# Patient Record
Sex: Male | Born: 2000 | Race: Asian | Hispanic: No | Marital: Single | Smoking: Never smoker
Health system: Southern US, Community
[De-identification: ages and names within clinical notes are randomized; demographics above are authoritative.]

## PROBLEM LIST (undated history)

## (undated) DIAGNOSIS — Z8709 Personal history of other diseases of the respiratory system: Secondary | ICD-10-CM

## (undated) DIAGNOSIS — S43015A Anterior dislocation of left humerus, initial encounter: Secondary | ICD-10-CM

## (undated) DIAGNOSIS — M2410 Other articular cartilage disorders, unspecified site: Secondary | ICD-10-CM

## (undated) DIAGNOSIS — F50814 Binge eating disorder, in remission: Secondary | ICD-10-CM

## (undated) DIAGNOSIS — F909 Attention-deficit hyperactivity disorder, unspecified type: Secondary | ICD-10-CM

## (undated) DIAGNOSIS — S43006A Unspecified dislocation of unspecified shoulder joint, initial encounter: Secondary | ICD-10-CM

## (undated) HISTORY — DX: Binge eating disorder, in remission: F50.814

## (undated) HISTORY — DX: Personal history of other diseases of the respiratory system: Z87.09

---
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
## 2019-01-17 DIAGNOSIS — Z111 Encounter for screening for respiratory tuberculosis: Secondary | ICD-10-CM | POA: Diagnosis not present

## 2019-01-17 DIAGNOSIS — Z0283 Encounter for blood-alcohol and blood-drug test: Secondary | ICD-10-CM | POA: Diagnosis not present

## 2019-03-11 DIAGNOSIS — Z20828 Contact with and (suspected) exposure to other viral communicable diseases: Secondary | ICD-10-CM | POA: Diagnosis not present

## 2019-03-13 DIAGNOSIS — H5213 Myopia, bilateral: Secondary | ICD-10-CM | POA: Diagnosis not present

## 2019-03-30 ENCOUNTER — Ambulatory Visit: Payer: Self-pay | Attending: Internal Medicine

## 2019-03-30 DIAGNOSIS — Z20822 Contact with and (suspected) exposure to covid-19: Secondary | ICD-10-CM

## 2019-03-31 LAB — NOVEL CORONAVIRUS, NAA: SARS-CoV-2, NAA: NOT DETECTED

## 2019-12-19 DIAGNOSIS — J00 Acute nasopharyngitis [common cold]: Secondary | ICD-10-CM | POA: Diagnosis not present

## 2019-12-19 DIAGNOSIS — Z23 Encounter for immunization: Secondary | ICD-10-CM | POA: Diagnosis not present

## 2019-12-25 DIAGNOSIS — Z713 Dietary counseling and surveillance: Secondary | ICD-10-CM | POA: Diagnosis not present

## 2020-01-01 DIAGNOSIS — Z713 Dietary counseling and surveillance: Secondary | ICD-10-CM | POA: Diagnosis not present

## 2020-01-08 DIAGNOSIS — Z713 Dietary counseling and surveillance: Secondary | ICD-10-CM | POA: Diagnosis not present

## 2020-01-22 DIAGNOSIS — Z713 Dietary counseling and surveillance: Secondary | ICD-10-CM | POA: Diagnosis not present

## 2020-01-29 DIAGNOSIS — Z713 Dietary counseling and surveillance: Secondary | ICD-10-CM | POA: Diagnosis not present

## 2020-02-05 DIAGNOSIS — Z713 Dietary counseling and surveillance: Secondary | ICD-10-CM | POA: Diagnosis not present

## 2020-02-11 DIAGNOSIS — Z713 Dietary counseling and surveillance: Secondary | ICD-10-CM | POA: Diagnosis not present

## 2020-02-13 ENCOUNTER — Other Ambulatory Visit (HOSPITAL_COMMUNITY): Payer: Self-pay

## 2020-02-26 DIAGNOSIS — Z713 Dietary counseling and surveillance: Secondary | ICD-10-CM | POA: Diagnosis not present

## 2020-03-10 DIAGNOSIS — Z713 Dietary counseling and surveillance: Secondary | ICD-10-CM | POA: Diagnosis not present

## 2020-03-19 ENCOUNTER — Other Ambulatory Visit (HOSPITAL_COMMUNITY): Payer: Self-pay

## 2020-03-25 DIAGNOSIS — Z713 Dietary counseling and surveillance: Secondary | ICD-10-CM | POA: Diagnosis not present

## 2020-04-15 DIAGNOSIS — Z713 Dietary counseling and surveillance: Secondary | ICD-10-CM | POA: Diagnosis not present

## 2020-05-14 ENCOUNTER — Other Ambulatory Visit (HOSPITAL_COMMUNITY): Payer: Self-pay

## 2020-07-15 ENCOUNTER — Other Ambulatory Visit (HOSPITAL_COMMUNITY): Payer: Self-pay

## 2020-07-15 MED FILL — Dexmethylphenidate HCl Tab 5 MG: ORAL | 30 days supply | Qty: 30 | Fill #0 | Status: AC

## 2020-07-15 MED FILL — Dexmethylphenidate HCl Cap ER 24 HR 30 MG: ORAL | 30 days supply | Qty: 30 | Fill #0 | Status: AC

## 2020-07-29 DIAGNOSIS — Z20822 Contact with and (suspected) exposure to covid-19: Secondary | ICD-10-CM | POA: Diagnosis not present

## 2020-08-11 ENCOUNTER — Other Ambulatory Visit (HOSPITAL_COMMUNITY): Payer: Self-pay

## 2020-08-11 MED ORDER — DEXMETHYLPHENIDATE HCL ER 30 MG PO CP24: 30.0000 mg | ORAL_CAPSULE | Freq: Every morning | ORAL | 0 refills | Status: DC

## 2020-08-11 MED ORDER — DEXMETHYLPHENIDATE HCL 5 MG PO TABS
5.0000 mg | ORAL_TABLET | Freq: Every day | ORAL | 0 refills | Status: DC
  Filled 2020-08-11: qty 30, 30d supply, fill #0

## 2020-08-11 MED ORDER — DEXMETHYLPHENIDATE HCL 5 MG PO TABS: 5.0000 mg | ORAL_TABLET | Freq: Every day | ORAL | 0 refills | Status: DC

## 2020-08-11 MED ORDER — DEXMETHYLPHENIDATE HCL ER 30 MG PO CP24
30.0000 mg | ORAL_CAPSULE | Freq: Every morning | ORAL | 0 refills | Status: DC
  Filled 2020-08-11: qty 30, 30d supply, fill #0

## 2020-09-10 ENCOUNTER — Other Ambulatory Visit (HOSPITAL_COMMUNITY): Payer: Self-pay

## 2020-09-10 MED ORDER — DEXMETHYLPHENIDATE HCL 5 MG PO TABS
5.0000 mg | ORAL_TABLET | Freq: Every day | ORAL | 0 refills | Status: DC
  Filled 2020-09-10: qty 30, 30d supply, fill #0

## 2020-09-10 MED ORDER — DEXMETHYLPHENIDATE HCL ER 30 MG PO CP24
1.0000 | ORAL_CAPSULE | Freq: Every morning | ORAL | 0 refills | Status: DC
  Filled 2020-09-10: qty 30, 30d supply, fill #0

## 2020-09-11 ENCOUNTER — Ambulatory Visit: Payer: 59 | Admitting: Family Medicine

## 2020-09-11 ENCOUNTER — Other Ambulatory Visit: Payer: Self-pay

## 2020-09-11 VITALS — BP 162/82 | Ht 66.0 in | Wt 205.0 lb

## 2020-09-11 DIAGNOSIS — M79672 Pain in left foot: Secondary | ICD-10-CM

## 2020-09-11 NOTE — Progress Notes (Signed)
Office Visit Note   Patient: Gavin Williams           Date of Birth: Aug 02, 2000           MRN: [id number] Visit Date: 09/11/2020 Requested by: Eliberto Ivory, MD [address] [facility], [place]. [place],  [place] [address] PCP: Eliberto Ivory, MD  Subjective: CC: Left foot pain  HPI: 20 year old male presenting to clinic with concerns of approximately 2 weeks of left foot pain.  Patient states that he tried running barefoot on a treadmill, which is an activity he has not done before, and he started to experience pain in his left forefoot.  He says he usually runs shod for about 20 to 30 minutes stretches, and this episode of barefoot running was approximately 30 minutes long.  Following this treadmill exercise, he states that he had significant pain with ambulation, and was limping for several days afterward.  He took ibuprofen which offered significant improvement.  He has rested his foot by avoiding running since the time of this injury, and has been using the stationary bike for exercise.  He tried running again earlier this week, and noticed a recurrence of his pain, which prompted his appointment today.  He no longer notices pain with ambulation.  He feels as though the pain is along the lateral aspect of his foot, though he has difficulty finding an exact spot.  He has no history of foot trauma or stress injuries.  He states his current running shoes are a little over 29 year old. Of note, patient is leaving for the [place] tomorrow for a study abroad trip.  He will not have access to a stationary bike [place], and is wondering what he can do for exercise while his foot is recovering.              ROS:   All other systems were reviewed and are negative.  Objective: Vital Signs: BP (!) 162/82   Ht 5\' 6"  (1.676 m)   Wt 205 lb (93 kg)   BMI 33.09 kg/m  [facility] Adult Exercise 09/11/2020  Frequency of aerobic exercise (# of  days/week) 6  Average time in minutes 45  Frequency of strengthening activities (# of days/week) 3     No flowsheet data found.  Physical Exam:  General:  Alert and oriented, in no acute distress. Pulm:  Breathing unlabored. Psy:  Normal mood, congruent affect. Skin: Left foot without bruises, rashes, or erythema. Overlying skin intact.   Left foot/ankle exam: General assessment: Normal Gait.  Inspection: No significant pes planus or cavus. Normal posterior tibialis function with feel raise.   No significant swelling or deformity.  Seated Exam: Ankle and all toes with full range of motion with no pain. Palpation: Endorses tenderness to palpation over the distal aspect of the fourth metatarsal shaft and metatarsal phalangeal joint.  Full range of motion and strength in the fourth toe without pain.  No tenderness along any of the other metatarsals, or within the midfoot.  No pain with calf or Achilles squeeze.  No Tenderness over the peroneal tendons.   Ligamentous Examination:  No Tenderness or laxity along medial or lateral ligamentous complexes.  Strength: 5/5 in eversion, inversion, and plantar/dorsiflexion.  Strength testing is not painful. Normal distal sensation   Imaging: Limited extremity ultrasound-left foot: Area of maximal tenderness correlates with distal aspect of fourth metatarsal.  There is no obvious cortical irregularity or overlying periosteal effusions.  Minimally increased Doppler flow in this area. No obvious spurring or effusions at the fourth MTP joint.  Impression: No evidence for stress fracture  Assessment & Plan: 20 year old male presenting to clinic with 2 weeks of left forefoot pain after running barefoot for the first time.  Suspect bony contusion or early stress reaction as an underlying source of his discomfort.  No pain with ambulation, so do not feel as though he would benefit from postop shoe or other immobilization at this time. -Encouraged to  remain on stationary bike for another 2 weeks if available at his study abroad location.  Otherwise he may try swimming if he has access, or walking for exercise. -Discussed that when he returns to running he should do a slow progression as his pain allows.  If he decides to continue with minimalist/barefoot running, he should reduce his running distance by about 50% from his typical shod running distance.  He should then increase by no more than 10 %/week. -His running shoes in clinic today appear quite old with near complete wear of the heel tread.  Discussed that he should invest in new wear shoes prior to his trip, as I suspect refreshed padding would offer significant improvement.  He does not have pain with ambulation in clinic, so we will hold on orthotics today pending new shoes. -Denies the need for prescription NSAID medications at this time.  Does not feel he will need them during his trip, as he has had good pain control with over-the-counter ibuprofen.  Discussed safe NSAID use. -If his pain has not resolved by the time he returns from his study abroad trip, he is to return to clinic for reevaluation. -Patient expressed understanding and agreement with plan.  He had no further questions or concerns today.     Procedures: No procedures performed      Addendum:  I was the preceptor for this visit and available for immediate consultation.  Norton Blizzard MD Marrianne Mood

## 2020-09-11 NOTE — Patient Instructions (Signed)
Thank you for coming to visit [place] today!  I suspect you stressed your left foot with your introduction to barefoot running.  Fortunately, I do not see any evidence of a stress fracture on your ultrasound today. -Continue with a stationary bike for another 1 to 2 weeks until you no longer feel pain within your foot. -Once her pain is resolved, you may try to return to a walk/run progression to assure your pain does not recur. -If able to tolerate this, you may slowly increase your running by no more than 10% weekly. -If your pain does not improve by the time you return from your trip, come back to clinic for reevaluation.

## 2020-10-13 ENCOUNTER — Other Ambulatory Visit (HOSPITAL_COMMUNITY): Payer: Self-pay

## 2020-10-13 MED ORDER — DEXMETHYLPHENIDATE HCL ER 30 MG PO CP24
1.0000 | ORAL_CAPSULE | Freq: Every morning | ORAL | 0 refills | Status: DC
  Filled 2020-10-13: qty 30, 30d supply, fill #0

## 2020-10-13 MED ORDER — DEXMETHYLPHENIDATE HCL 5 MG PO TABS
5.0000 mg | ORAL_TABLET | Freq: Every day | ORAL | 0 refills | Status: DC
  Filled 2020-10-13: qty 30, 30d supply, fill #0

## 2020-10-23 ENCOUNTER — Other Ambulatory Visit (HOSPITAL_COMMUNITY): Payer: Self-pay

## 2020-11-10 ENCOUNTER — Other Ambulatory Visit (HOSPITAL_COMMUNITY): Payer: Self-pay

## 2020-11-10 MED ORDER — DEXMETHYLPHENIDATE HCL 5 MG PO TABS
5.0000 mg | ORAL_TABLET | Freq: Every day | ORAL | 0 refills | Status: DC
  Filled 2020-12-13: qty 30, 30d supply, fill #0

## 2020-11-10 MED ORDER — DEXMETHYLPHENIDATE HCL ER 30 MG PO CP24
30.0000 mg | ORAL_CAPSULE | Freq: Every morning | ORAL | 0 refills | Status: DC
  Filled 2021-01-19: qty 30, 30d supply, fill #0

## 2020-11-10 MED ORDER — DEXMETHYLPHENIDATE HCL 5 MG PO TABS
5.0000 mg | ORAL_TABLET | Freq: Every day | ORAL | 0 refills | Status: DC
  Filled 2020-11-10: qty 30, 30d supply, fill #0

## 2020-11-10 MED ORDER — DEXMETHYLPHENIDATE HCL ER 30 MG PO CP24
30.0000 mg | ORAL_CAPSULE | Freq: Every morning | ORAL | 0 refills | Status: DC
  Filled 2020-11-10: qty 30, 30d supply, fill #0

## 2020-11-10 MED ORDER — DEXMETHYLPHENIDATE HCL 5 MG PO TABS
5.0000 mg | ORAL_TABLET | Freq: Every day | ORAL | 0 refills | Status: DC
  Filled 2021-01-19: qty 30, 30d supply, fill #0

## 2020-11-10 MED ORDER — DEXMETHYLPHENIDATE HCL ER 30 MG PO CP24
30.0000 mg | ORAL_CAPSULE | Freq: Every morning | ORAL | 0 refills | Status: DC
  Filled 2020-12-13: qty 30, 30d supply, fill #0

## 2020-11-11 ENCOUNTER — Other Ambulatory Visit (HOSPITAL_COMMUNITY): Payer: Self-pay

## 2020-12-13 ENCOUNTER — Other Ambulatory Visit (HOSPITAL_COMMUNITY): Payer: Self-pay

## 2020-12-25 DIAGNOSIS — Z23 Encounter for immunization: Secondary | ICD-10-CM | POA: Diagnosis not present

## 2021-01-19 ENCOUNTER — Other Ambulatory Visit (HOSPITAL_COMMUNITY): Payer: Self-pay

## 2021-01-20 ENCOUNTER — Other Ambulatory Visit (HOSPITAL_COMMUNITY): Payer: Self-pay

## 2021-02-02 ENCOUNTER — Other Ambulatory Visit (HOSPITAL_COMMUNITY): Payer: Self-pay

## 2021-02-02 MED ORDER — DEXMETHYLPHENIDATE HCL ER 30 MG PO CP24
ORAL_CAPSULE | ORAL | 0 refills | Status: DC
  Filled 2021-04-12: qty 30, fill #0
  Filled 2021-04-23: qty 30, 30d supply, fill #0

## 2021-02-02 MED ORDER — DEXMETHYLPHENIDATE HCL 5 MG PO TABS
ORAL_TABLET | ORAL | 0 refills | Status: DC
  Filled 2021-03-24: qty 30, 30d supply, fill #0

## 2021-02-02 MED ORDER — DEXMETHYLPHENIDATE HCL ER 30 MG PO CP24
ORAL_CAPSULE | ORAL | 0 refills | Status: DC
  Filled 2021-02-02: qty 30, 30d supply, fill #0
  Filled 2021-02-06: qty 30, fill #0
  Filled 2021-02-11: qty 30, 30d supply, fill #0

## 2021-02-02 MED ORDER — DEXMETHYLPHENIDATE HCL 5 MG PO TABS
ORAL_TABLET | ORAL | 0 refills | Status: DC
  Filled 2021-04-12: qty 30, fill #0
  Filled 2021-04-23: qty 30, 30d supply, fill #0

## 2021-02-02 MED ORDER — DEXMETHYLPHENIDATE HCL ER 30 MG PO CP24
ORAL_CAPSULE | ORAL | 0 refills | Status: DC
  Filled 2021-03-24: qty 30, 30d supply, fill #0

## 2021-02-02 MED ORDER — DEXMETHYLPHENIDATE HCL 5 MG PO TABS
ORAL_TABLET | ORAL | 0 refills | Status: DC
  Filled 2021-02-02: qty 30, 30d supply, fill #0
  Filled 2021-02-06: qty 30, fill #0
  Filled 2021-02-11: qty 30, 30d supply, fill #0

## 2021-02-06 ENCOUNTER — Other Ambulatory Visit (HOSPITAL_COMMUNITY): Payer: Self-pay

## 2021-02-11 ENCOUNTER — Other Ambulatory Visit (HOSPITAL_COMMUNITY): Payer: Self-pay

## 2021-02-13 ENCOUNTER — Other Ambulatory Visit (HOSPITAL_COMMUNITY): Payer: Self-pay

## 2021-03-24 ENCOUNTER — Other Ambulatory Visit (HOSPITAL_COMMUNITY): Payer: Self-pay

## 2021-03-30 DIAGNOSIS — Z0184 Encounter for antibody response examination: Secondary | ICD-10-CM | POA: Diagnosis not present

## 2021-03-30 DIAGNOSIS — Z23 Encounter for immunization: Secondary | ICD-10-CM | POA: Diagnosis not present

## 2021-04-09 DIAGNOSIS — Z23 Encounter for immunization: Secondary | ICD-10-CM | POA: Diagnosis not present

## 2021-04-13 ENCOUNTER — Other Ambulatory Visit (HOSPITAL_COMMUNITY): Payer: Self-pay

## 2021-04-23 ENCOUNTER — Other Ambulatory Visit (HOSPITAL_COMMUNITY): Payer: Self-pay

## 2021-04-23 DIAGNOSIS — J029 Acute pharyngitis, unspecified: Secondary | ICD-10-CM | POA: Diagnosis not present

## 2021-04-23 DIAGNOSIS — R6883 Chills (without fever): Secondary | ICD-10-CM | POA: Diagnosis not present

## 2021-04-23 DIAGNOSIS — J069 Acute upper respiratory infection, unspecified: Secondary | ICD-10-CM | POA: Diagnosis not present

## 2021-05-11 DIAGNOSIS — F900 Attention-deficit hyperactivity disorder, predominantly inattentive type: Secondary | ICD-10-CM | POA: Diagnosis not present

## 2021-05-12 DIAGNOSIS — Z299 Encounter for prophylactic measures, unspecified: Secondary | ICD-10-CM | POA: Diagnosis not present

## 2021-05-15 ENCOUNTER — Other Ambulatory Visit (HOSPITAL_COMMUNITY): Payer: Self-pay

## 2021-05-15 MED ORDER — DEXMETHYLPHENIDATE HCL ER 30 MG PO CP24
ORAL_CAPSULE | ORAL | 0 refills | Status: DC
  Filled 2021-07-10: qty 30, 30d supply, fill #0

## 2021-05-15 MED ORDER — DEXMETHYLPHENIDATE HCL 5 MG PO TABS
ORAL_TABLET | ORAL | 0 refills | Status: DC
  Filled 2021-05-15: qty 60, 30d supply, fill #0

## 2021-05-15 MED ORDER — DEXMETHYLPHENIDATE HCL ER 30 MG PO CP24
ORAL_CAPSULE | ORAL | 0 refills | Status: DC
  Filled 2021-07-28: qty 30, fill #0
  Filled 2021-07-30 – 2021-08-06 (×2): qty 30, 30d supply, fill #0

## 2021-05-15 MED ORDER — DEXMETHYLPHENIDATE HCL ER 30 MG PO CP24
ORAL_CAPSULE | ORAL | 0 refills | Status: DC
  Filled 2021-05-15: qty 30, 30d supply, fill #0
  Filled 2021-05-18: qty 30, fill #0
  Filled 2021-05-21: qty 30, 30d supply, fill #0

## 2021-05-18 ENCOUNTER — Other Ambulatory Visit (HOSPITAL_COMMUNITY): Payer: Self-pay

## 2021-05-21 ENCOUNTER — Other Ambulatory Visit (HOSPITAL_COMMUNITY): Payer: Self-pay

## 2021-07-10 ENCOUNTER — Other Ambulatory Visit (HOSPITAL_COMMUNITY): Payer: Self-pay

## 2021-07-15 ENCOUNTER — Other Ambulatory Visit (HOSPITAL_COMMUNITY): Payer: Self-pay

## 2021-07-21 ENCOUNTER — Other Ambulatory Visit (HOSPITAL_COMMUNITY): Payer: Self-pay

## 2021-07-28 ENCOUNTER — Other Ambulatory Visit (HOSPITAL_COMMUNITY): Payer: Self-pay

## 2021-07-30 ENCOUNTER — Other Ambulatory Visit (HOSPITAL_COMMUNITY): Payer: Self-pay

## 2021-08-03 ENCOUNTER — Other Ambulatory Visit (HOSPITAL_COMMUNITY): Payer: Self-pay

## 2021-08-04 DIAGNOSIS — Z111 Encounter for screening for respiratory tuberculosis: Secondary | ICD-10-CM | POA: Diagnosis not present

## 2021-08-06 ENCOUNTER — Other Ambulatory Visit (HOSPITAL_COMMUNITY): Payer: Self-pay

## 2021-08-07 ENCOUNTER — Other Ambulatory Visit (HOSPITAL_COMMUNITY): Payer: Self-pay

## 2021-08-12 ENCOUNTER — Other Ambulatory Visit (HOSPITAL_COMMUNITY): Payer: Self-pay

## 2021-08-12 DIAGNOSIS — F900 Attention-deficit hyperactivity disorder, predominantly inattentive type: Secondary | ICD-10-CM | POA: Diagnosis not present

## 2021-08-12 MED ORDER — DEXMETHYLPHENIDATE HCL ER 30 MG PO CP24
ORAL_CAPSULE | ORAL | 0 refills | Status: DC
  Filled 2021-09-03: qty 30, 30d supply, fill #0

## 2021-08-12 MED ORDER — DEXMETHYLPHENIDATE HCL 5 MG PO TABS
ORAL_TABLET | ORAL | 0 refills | Status: DC
  Filled 2021-08-12: qty 16, 8d supply, fill #0
  Filled 2021-08-12: qty 44, 22d supply, fill #0

## 2021-08-12 MED ORDER — DEXMETHYLPHENIDATE HCL ER 30 MG PO CP24
ORAL_CAPSULE | ORAL | 0 refills | Status: DC
  Filled 2021-08-12 – 2021-10-01 (×2): qty 30, 30d supply, fill #0

## 2021-08-19 ENCOUNTER — Other Ambulatory Visit (HOSPITAL_COMMUNITY): Payer: Self-pay

## 2021-09-03 ENCOUNTER — Other Ambulatory Visit (HOSPITAL_COMMUNITY): Payer: Self-pay

## 2021-09-16 ENCOUNTER — Telehealth: Payer: Self-pay | Admitting: Internal Medicine

## 2021-09-16 NOTE — Telephone Encounter (Signed)
Gavin Williams is son of Dr Pearlean Brownie. Having astham and eczema. Wanting new consult before he heads off to college. NExt week. Can you fit him in please. I can come early 8.30am or last thing in morning next Friday 09/25/21

## 2021-09-17 ENCOUNTER — Telehealth: Payer: Self-pay | Admitting: Internal Medicine

## 2021-09-17 NOTE — Telephone Encounter (Signed)
Called patient to scheduled appointment. Was not able to leave message.

## 2021-09-17 NOTE — Telephone Encounter (Signed)
See encounter from 09/16/2021.

## 2021-09-21 NOTE — Telephone Encounter (Signed)
Called pt and left voicemail. Stated that MR could see him on 7/7 at either 8:30 or 11:45.

## 2021-09-25 ENCOUNTER — Ambulatory Visit: Payer: 59 | Admitting: Internal Medicine

## 2021-09-25 ENCOUNTER — Encounter: Payer: Self-pay | Admitting: Internal Medicine

## 2021-09-25 ENCOUNTER — Ambulatory Visit (INDEPENDENT_AMBULATORY_CARE_PROVIDER_SITE_OTHER): Payer: 59

## 2021-09-25 ENCOUNTER — Other Ambulatory Visit (HOSPITAL_COMMUNITY): Payer: Self-pay

## 2021-09-25 VITALS — BP 128/72 | HR 84 | Temp 97.9°F | Ht 66.0 in | Wt 187.8 lb

## 2021-09-25 DIAGNOSIS — R062 Wheezing: Secondary | ICD-10-CM

## 2021-09-25 DIAGNOSIS — R059 Cough, unspecified: Secondary | ICD-10-CM

## 2021-09-25 LAB — NITRIC OXIDE: Nitric Oxide: 22

## 2021-09-25 LAB — CBC WITH DIFFERENTIAL/PLATELET
Basophils Absolute: 0 10*3/uL (ref 0.0–0.1)
Basophils Relative: 0.5 % (ref 0.0–3.0)
Eosinophils Absolute: 0.2 10*3/uL (ref 0.0–0.7)
Eosinophils Relative: 3.1 % (ref 0.0–5.0)
HCT: 49.3 % (ref 39.0–52.0)
Hemoglobin: 16.6 g/dL (ref 13.0–17.0)
Lymphocytes Relative: 45 % (ref 12.0–46.0)
Lymphs Abs: 2.5 10*3/uL (ref 0.7–4.0)
MCHC: 33.6 g/dL (ref 30.0–36.0)
MCV: 91.6 fl (ref 78.0–100.0)
Monocytes Absolute: 0.7 10*3/uL (ref 0.1–1.0)
Monocytes Relative: 12.6 % — ABNORMAL HIGH (ref 3.0–12.0)
Neutro Abs: 2.2 10*3/uL (ref 1.4–7.7)
Neutrophils Relative %: 38.8 % — ABNORMAL LOW (ref 43.0–77.0)
Platelets: 259 10*3/uL (ref 150.0–400.0)
RBC: 5.38 Mil/uL (ref 4.22–5.81)
RDW: 13.7 % (ref 11.5–15.5)
WBC: 5.5 10*3/uL (ref 4.0–10.5)

## 2021-09-25 MED ORDER — ALBUTEROL SULFATE HFA 108 (90 BASE) MCG/ACT IN AERS
2.0000 | INHALATION_SPRAY | Freq: Four times a day (QID) | RESPIRATORY_TRACT | 6 refills | Status: DC | PRN
Start: 2021-09-25 — End: 2023-12-12
  Filled 2021-09-25: qty 18, 25d supply, fill #0

## 2021-09-25 MED ORDER — FLUTICASONE FUROATE-VILANTEROL 100-25 MCG/ACT IN AEPB
1.0000 | INHALATION_SPRAY | Freq: Every day | RESPIRATORY_TRACT | 5 refills | Status: DC
  Filled 2021-09-25: qty 60, 30d supply, fill #0

## 2021-09-25 MED ORDER — FLUTICASONE FUROATE-VILANTEROL 100-25 MCG/ACT IN AEPB: 1.0000 | INHALATION_SPRAY | Freq: Every day | RESPIRATORY_TRACT | 0 refills | Status: DC

## 2021-09-25 NOTE — Patient Instructions (Addendum)
ICD-10-CM   1. Cough, unspecified type  R05.9     2. Wheeze  R06.2       Very likely asthma  though feno test is 22ppb and normal  Plan  - cxr 2 view - cbc with diff, IgE and RAST allergy panel blood work 09/25/2021 - start breo 100 strength 1 puff daily scheduled - start albuterol as needed  - both medicines to have 12 month refills - recommend doing full PFT breathing test when you have time next fw months  Followup - video call in 4-8 weeks with DR Marchelle Gearing or APP - to get results and ensure treatment plan is working - 3-12 months or sooner if needed

## 2021-09-25 NOTE — Progress Notes (Signed)
OV 09/25/2021  Subjective:  Patient ID: Gavin Williams, male , DOB: 02/19/2001 , age 21 y.o. , MRN: [id number] , ADDRESS: [address] [place] [address] PCP Eliberto Ivory, MD Patient Care Team: Eliberto Ivory, MD as PCP - General (Pediatrics)  This Provider for this visit: Treatment Team:  Attending Provider: Kalman Shan, MD    09/25/2021 -   Chief Complaint  Patient presents with   Consult    Pt states he was diagnosed with asthma as a child and states he has not had any problems until about 5 weeks ago. States he will begin to wheeze and have a wet cough. Denies any problems with SOB or chest tightness.     HPI Gavin Williams 21 y.o. -as a new consult.  He is finished undergraduate in [place].  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  He has 4 weeks remaining before he starts medical school.  He says that very early on in childhood he had asthma but he does not remember this.  This what his parents told him.  He has been athletic playing tennis for [employer] day school and subsequently also an [place] folk music dance aerobic kind of activity.  Some 5 weeks ago he switched to long distance running.  He covers 3-5 miles in 30 minutes this was on a treadmill and outdoors.  Around the same time he started noticing development of a "wet cough".  Although it sounds wet he does not bring out anything but occasionally bring out white stuff.  He feels there is an associated wheeze with this periodically.  When he runs the cough does not bother him but sometimes he will notice a wheeze.  His running energy levels are not impacted by the symptoms.  The symptoms are not present during the night.  There is no shortness of breath there is no chest pain.  2 of his cousins have asthma on his maternal side.  His exam nitric oxide today was normal  He is willing to try inhaler  It will be difficult for him to follow-up physically here although  is willing to do video visits once August 2023 starts.    CT Chest data  No results found.    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   reports that he has never smoked. He has never used smokeless tobacco.  Past Surgical History:  Procedure Laterality Date   SHOULDER ARTHROSCOPY WITH BANKART REPAIR Left 03/16/2016   Procedure: LEFT SHOULDER ARTHROSCOPY, DEBRIDEMENT WITH BANKART REPAIR;  Surgeon: Teryl Lucy, MD;  Location: [facility];  Service: Orthopedics;  Laterality: Left;    No Known Allergies   There is no immunization history on file for this patient.  Family History  Problem Relation Age of Onset   Hypertension Mother    Hyperlipidemia Maternal Grandmother    Diabetes Maternal Grandfather    Hypertension Maternal Grandfather    Heart disease Paternal Grandfather      Current Outpatient Medications:    dexmethylphenidate (FOCALIN) 5 MG tablet, Take 1 to 2 tablets by mouth daily as needed, Disp: 60 tablet, Rfl: 0   Dexmethylphenidate HCl (FOCALIN XR) 30 MG CP24, Take 1 capsule by  mouth in the morning 10/01/21, Disp: 30 capsule, Rfl: 0   fluticasone furoate-vilanterol (BREO ELLIPTA) 100-25 MCG/ACT AEPB, Inhale 1 puff into the lungs daily., Disp: 14 each, Rfl: 0      Objective:   Vitals:   09/25/21 0831  BP: 128/72  Pulse: 84  Temp: 97.9 F (36.6 C)  TempSrc: Oral  SpO2: 100%  Weight: 187 lb 12.8 oz (85.2 kg)  Height: 5\' 6"  (1.676 m)    Estimated body mass index is 30.31 kg/m as calculated from the following:   Height as of this encounter: 5\' 6"  (1.676 m).   Weight as of this encounter: 187 lb 12.8 oz (85.2 kg).  @WEIGHTCHANGE @    09/25/21 0831  Weight: 187 lb 12.8 oz (85.2 kg)     Physical Exam  General  Appearance:    Alert, cooperative, no distress, appears stated age - looks well , Deconditioned looking - no , OBESE  - no, Sitting on Wheelchair -  no  Head:    Normocephalic, without obvious abnormality, atraumatic  Eyes:    PERRL, conjunctiva/corneas clear,  Ears:    Normal TM's and external ear canals, both ears  Nose:   Nares normal, septum midline, mucosa normal, no drainage    or sinus tenderness. OXYGEN ON  - no . Patient is @ ra   Throat:   Lips, mucosa, and tongue normal; teeth and gums normal. Cyanosis on lips - no  Neck:   Supple, symmetrical, trachea midline, no adenopathy;    thyroid:  no enlargement/tenderness/nodules; no carotid   bruit or JVD  Back:     Symmetric, no curvature, ROM normal, no CVA tenderness  Lungs:     Distress - no , Wheeze no, Barrell Chest - no, Purse lip breathing - no, Crackles - no   Chest Wall:    No tenderness or deformity.    Heart:    Regular rate and rhythm, S1 and S2 normal, no rub   or gallop, Murmur - no  Breast Exam:    NOT DONE  Abdomen:     Soft, non-tender, bowel sounds active all four quadrants,    no masses, no organomegaly. Visceral obesity - no  Genitalia:   NOT DONE  Rectal:   NOT DONE  Extremities:   Extremities - normal, Has Cane - no, Clubbing - no, Edema - no  Pulses:   2+ and symmetric all extremities  Skin:   Stigmata of Connective Tissue Disease - no  Lymph nodes:   Cervical, supraclavicular, and axillary nodes normal  Psychiatric:  Neurologic:   Pleasant - yes, Anxious - no, Flat affect - no  CAm-ICU - neg, Alert and Oriented x 3 - yes, Moves all 4s - yes, Speech - normal, Cognition - intact        Assessment:       ICD-10-CM   1. Cough, unspecified type  R05.9 Nitric oxide    2. Wheeze  R06.2 Nitric oxide         Plan:     Patient Instructions     ICD-10-CM   1. Cough, unspecified type  R05.9     2. Wheeze  R06.2       Very likely asthma  though feno test is 22ppb and normal  Plan  - cxr 2 view -  cbc with diff, IgE and RAST allergy panel blood work 09/25/2021 - start breo 100 strength 1 puff daily scheduled - start albuterol as needed  -  both medicines to have 12 month refills - recommend doing full PFT breathing test when you have time next fw months  Followup - video call in 4-8 weeks with DR Chase Caller or APP - to get results and ensure treatment plan is working - 3-12 months or sooner if needed    SIGNATURE    Dr. Brand Males, M.D., F.C.C.P,  Pulmonary and Critical Care Medicine Staff Physician, [place] Director - Interstitial Lung Disease  Program  Pulmonary [place] at [place], [place], [address]  Pager: [phone], If no answer or between  15:00h - 7:00h: call [phone] Telephone: [phone]  8:53 AM 09/25/2021

## 2021-09-28 LAB — RESPIRATORY ALLERGY PROFILE REGION II ~~LOC~~
Allergen, A. alternata, m6: <0.1 kU/L
Allergen, Cedar tree, t12: 0.41 kU/L — ABNORMAL HIGH
Allergen, Comm Silver Birch, t9: <0.1 kU/L
Allergen, Cottonwood, t14: <0.1 kU/L
Allergen, D pternoyssinus,d7: 0.34 kU/L — ABNORMAL HIGH
Allergen, Mouse Urine Protein, e78: <0.1 kU/L
Allergen, Mulberry, t76: <0.1 kU/L
Allergen, Oak,t7: <0.1 kU/L
Allergen, P. notatum, m1: <0.1 kU/L
Aspergillus fumigatus, m3: <0.1 kU/L
Bermuda Grass: <0.1 kU/L
Box Elder IgE: 0.22 kU/L — ABNORMAL HIGH
CLADOSPORIUM HERBARUM (M2) IGE: <0.1 kU/L
COMMON RAGWEED (SHORT) (W1) IGE: <0.1 kU/L
Cat Dander: <0.1 kU/L
Class: 0
Class: 0
Class: 0
Class: 0
Class: 0
Class: 0
Class: 0
Class: 0
Class: 0
Class: 0
Class: 0
Class: 0
Class: 0
Class: 0
Class: 0
Class: 0
Class: 0
Class: 0
Class: 1
Class: 1
Class: 3
Cockroach: 0.1 kU/L — ABNORMAL HIGH
D. farinae: 0.47 kU/L — ABNORMAL HIGH
Dog Dander: <0.1 kU/L
Elm IgE: <0.1 kU/L
IgE (Immunoglobulin E), Serum: 82 kU/L (ref <=114)
Johnson Grass: <0.1 kU/L
Pecan/Hickory Tree IgE: 4.95 kU/L — ABNORMAL HIGH
Rough Pigweed  IgE: <0.1 kU/L
Sheep Sorrel IgE: <0.1 kU/L
Timothy Grass: <0.1 kU/L

## 2021-09-28 LAB — INTERPRETATION:

## 2021-10-01 ENCOUNTER — Other Ambulatory Visit (HOSPITAL_COMMUNITY): Payer: Self-pay

## 2021-10-02 ENCOUNTER — Other Ambulatory Visit (HOSPITAL_COMMUNITY): Payer: Self-pay

## 2021-10-07 NOTE — Telephone Encounter (Signed)
Pt sent mychart message asking about the results of his labwork from last visit pt also stated that he forgot to mention at last OV that he had developed some small skin bumps on his arm at the same time as the onset of wheezing and wants to know if this might have any relevance to him being allergic to anything. States that the bumps do not itch and are skin colored.  MR, please advise on all this for pt.

## 2021-10-07 NOTE — Telephone Encounter (Signed)
   Blood work shows allergfies to dust mite and some grasses - at a milld level  SKin bumps can be related to allergy phenoptye  Plan  - continue the maintennace inhaler - if skin bumps continue or wheeze not controlled will benefit from allergy referall   Thanks    SIGNATURE    Dr. Kalman Shan, M.D., F.C.C.P,  Pulmonary and Critical Care Medicine Staff Physician, [facility] Director - Interstitial Lung Disease  Program  Medical Director - [facility] Pulmonary Fibrosis [facility] at [place], [place], [address]  NPI Number:  NPI #[id number] DEA Number: [id number]  Pager: [phone], If no answer  -> Check AMION or Try [phone] Telephone (clinical office): [phone] Telephone (research): [phone]  4:47 PM 10/07/2021     Latest Reference Range & Units 09/25/21 09:05  Interpretation  Pend  WBC 4.0 - 10.5 K/uL 5.5  RBC 4.22 - 5.81 Mil/uL 5.38  Hemoglobin 13.0 - 17.0 g/dL 67.5  HCT 91.6 - 38.4 % 49.3  MCV 78.0 - 100.0 fl 91.6  MCHC 30.0 - 36.0 g/dL 66.5  RDW 99.3 - 57.0 % 13.7  Platelets 150.0 - 400.0 K/uL 259.0  Neutrophils 43.0 - 77.0 % 38.8 (L)  Lymphocytes 12.0 - 46.0 % 45.0  Monocytes Relative 3.0 - 12.0 % 12.6 (H)  Eosinophil 0.0 - 5.0 % 3.1  Basophil 0.0 - 3.0 % 0.5  NEUT# 1.4 - 7.7 K/uL 2.2  Lymphocyte # 0.7 - 4.0 K/uL 2.5  Monocyte # 0.1 - 1.0 K/uL 0.7  Eosinophils Absolute 0.0 - 0.7 K/uL 0.2  Basophils Absolute 0.0 - 0.1 K/uL 0.0  Sheep Sorrel IgE kU/L <0.10  Pecan/Hickory Tree IgE kU/L 4.95 (H)  IgE (Immunoglobulin E), Serum <OR=114 kU/L 82  Allergen, D pternoyssinus,d7 kU/L 0.34 (H)  Cat Dander kU/L <0.10  Dog Dander kU/L <0.10  [place] Grass kU/L <0.10  Johnson Grass kU/L <0.10  Timothy Grass kU/L <0.10  Cockroach kU/L 0.10 (H)  Aspergillus fumigatus, m3 kU/L <0.10  Allergen, Comm Silver Charletta Cousin, t9 kU/L <0.10  Allergen, Cottonwood, t14 kU/L <0.10  Elm IgE kU/L <0.10   Allergen, Mulberry, t76 kU/L <0.10  Allergen, Oak,t7 kU/L <0.10  COMMON RAGWEED (SHORT) (W1) IGE kU/L <0.10  Allergen, Mouse Urine Protein, e78 kU/L <0.10  D. farinae kU/L 0.47 (H)  Allergen, Cedar tree, t12 kU/L 0.41 (H)  Box Elder IgE kU/L 0.22 (H)  Rough Pigweed  IgE kU/L <0.10  DG CHEST 2 VIEW  Rpt  Allergen, A. alternata, m6 kU/L <0.10  Allergen, P. notatum, m1 kU/L <0.10  CLADOSPORIUM HERBARUM (M2) IGE kU/L <0.10  Class  0/1 0 0 1 0 0 0 0 0 0  0/1 0/1 0 1 0 0 0 3 0 0 0  0 0 0  (L): Data is abnormally low (H): Data is abnormally high Rpt: View report in Results Review for more information

## 2021-10-23 ENCOUNTER — Telehealth (INDEPENDENT_AMBULATORY_CARE_PROVIDER_SITE_OTHER): Payer: 59 | Admitting: Adult Health

## 2021-10-23 ENCOUNTER — Encounter: Payer: Self-pay | Admitting: Adult Health

## 2021-10-23 VITALS — Ht 66.0 in | Wt 184.0 lb

## 2021-10-23 DIAGNOSIS — J45909 Unspecified asthma, uncomplicated: Secondary | ICD-10-CM

## 2021-10-23 NOTE — Patient Instructions (Signed)
Continue on Breo 1 puff daily, rinse after use Albuterol inhaler as needed Activity as tolerated Claritin 10 mg daily as needed for drainage Follow-up in 4 months with PFTs with Dr. Marchelle Gearing and as needed

## 2021-10-23 NOTE — Progress Notes (Signed)
Virtual Visit via Video Note  I connected with Gavin Williams on 10/23/21 at  9:00 AM EDT by a video enabled telemedicine application and verified that I am speaking with the correct person using two identifiers.  Location: Patient: Home  Provider: Office    I discussed the limitations of evaluation and management by telemedicine and the availability of in person appointments. The patient expressed understanding and agreed to proceed.  History of Present Illness: 21 year old male never smoker seen for pulmonary consult September 25, 2021 for chronic cough.  Patient was felt to have underlying asthma.  Today's video visit is a 1 month follow-up for asthma.  Patient was seen last visit for a pulmonary consult.  He been having ongoing cough.  Was felt to have some underlying asthma.  Was started on Breo.  Chest x-ray was clear.  Lab work showed IgE at 82, positive allergy profile to dust, cedar trees and hickory trees.  Eosinophils were 200 absolute count.  Patient says since last visit he is feeling much better, cough has totally resolved.  Has had no coughing in the last 2 weeks.  He has had rare albuterol use.  Patient says activity level is at baseline. 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  He has been set up for PFTs.  Which will have to be set up in December on return back from medical school.  Patient does say that Virgel Bouquet is expensive.  He is getting check with his insurance formulary to see if he can find something that is more affordable on his formulary.  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 18 g 6   fluticasone furoate-vilanterol (BREO ELLIPTA) 100-25 MCG/ACT AEPB Inhale 1 puff into the lungs daily. 14 each 0   fluticasone furoate-vilanterol (BREO ELLIPTA) 100-25 MCG/ACT AEPB Inhale 1 puff into the lungs daily. 60 each 5   dexmethylphenidate (FOCALIN) 5 MG tablet Take 1 to 2 tablets by mouth daily as needed 60 tablet 0   Dexmethylphenidate HCl (FOCALIN XR) 30 MG CP24 Take 1 capsule by mouth in the morning 10/01/21 30 capsule 0   No current facility-administered medications on file prior to visit.        Observations/Objective: Feno 09/25/2021 nml 22ppb Appears in good health with no obvious distress  Assessment and Plan: Asthma with excellent control and improved symptoms on Breo. Trigger prevention discussed in detail.  Asthma action plan discussed  Plan  Patient Instructions  Continue on Breo 1 puff daily, rinse after use Albuterol inhaler as needed Activity as tolerated Claritin 10 mg daily as needed for drainage Follow-up in 4 months with PFTs with Dr. Marchelle Gearing and as needed     Follow Up Instructions:    I discussed the assessment and treatment plan with the patient. The patient was provided an opportunity to ask questions and all were answered. The patient agreed with the plan and demonstrated an understanding of the instructions.   The patient was advised to call back or seek an in-person evaluation  if the symptoms worsen or if the condition fails to improve as anticipated.  I provided 22  minutes of non-face-to-face time during this encounter.   Rubye Oaks, NP

## 2021-11-02 ENCOUNTER — Other Ambulatory Visit (HOSPITAL_COMMUNITY): Payer: Self-pay

## 2021-11-02 NOTE — Telephone Encounter (Signed)
Pharmacy, Pt sent her formulary to see if there are any cheaper alternatives to her Breo. Could you let us know. I looked and it seems Duoneb is the only cheaper option. Thanks.

## 2021-11-02 NOTE — Telephone Encounter (Signed)
Dr. Marchelle Gearing, please advise on pt's message. She states her Virgel Bouquet is $75/month. Per pharmacy, Advair is cheaper.

## 2021-11-03 ENCOUNTER — Telehealth: Payer: Self-pay | Admitting: Internal Medicine

## 2021-11-03 DIAGNOSIS — J45909 Unspecified asthma, uncomplicated: Secondary | ICD-10-CM

## 2021-11-03 NOTE — Telephone Encounter (Signed)
Cando adavir 250/50 1 puff twice daily . Rinse mouth after use

## 2021-11-04 NOTE — Telephone Encounter (Signed)
Hey ladies,   If patient is no longer using the [facility] will his Advair still cost hm only $5. If he uses a pharmacy that is out of state because now he lives in [place] what will the cost be.  Thank you

## 2021-11-09 ENCOUNTER — Other Ambulatory Visit (HOSPITAL_COMMUNITY): Payer: Self-pay

## 2021-11-09 MED ORDER — FLUTICASONE-SALMETEROL 250-50 MCG/ACT IN AEPB: 1.0000 | INHALATION_SPRAY | Freq: Two times a day (BID) | RESPIRATORY_TRACT | 11 refills | Status: DC

## 2021-11-09 NOTE — Telephone Encounter (Signed)
Called and spoke to patient and advised him that the cost should still be $5. Nothing further needed

## 2022-02-02 ENCOUNTER — Encounter: Payer: Self-pay | Admitting: Internal Medicine

## 2022-02-02 NOTE — Telephone Encounter (Signed)
I think it is a good idea to get the pulmonary function test just to establish a baseline even though he is feeling better and not using the inhaler.  Please apologize for the challenges she has had getting hold of [place].  He lives in [place] so if the pulmonary function test can be scheduled when he is in [place] for holidays please do that.  If not we can do it when he is next coming back to [facility] even if it means spring 2024      No data to display

## 2022-02-02 NOTE — Telephone Encounter (Signed)
MR, please advise on pt's message regarding PFT. Thanks.

## 2022-03-08 ENCOUNTER — Telehealth: Payer: Self-pay | Admitting: Internal Medicine

## 2022-03-08 NOTE — Telephone Encounter (Signed)
See past encounter. PT states he is feeling better and wonders if he still needs a PFT test as a baseline as recom by Dr. Elvera Lennox. I see we made appt for him. He thought that was for PFT but it is an office visit. Please call to provide more clarity. Student so schedule is hard. He lives in [place]. Says MYCHART reply would be best for him. Basic question is he feels great now (allergies) should he still have the PFT test as recom by Dr. Elvera Lennox. He runs 20 mi a week now. No wheezing. Feels just seasonal.TY. [phone]

## 2022-03-08 NOTE — Telephone Encounter (Signed)
I think okay to manage without pulmonary function test but ideal that he get 1 sometime in the next 1 year.  This because sometimes and very occasionally patients might feel well but the pulmonary function test will be below normal.  This: is called Under perceiver  If he does not want to do it at this visit then at some point in time in the next 1 year it will be really ideal to get 1 done.

## 2022-03-09 NOTE — Telephone Encounter (Signed)
Mychart message sent with recommendations. Will follow through with mychart. Nothing further needed

## 2022-03-11 ENCOUNTER — Ambulatory Visit: Payer: 59 | Admitting: Internal Medicine

## 2022-06-03 ENCOUNTER — Other Ambulatory Visit (HOSPITAL_COMMUNITY): Payer: Self-pay

## 2022-06-04 ENCOUNTER — Other Ambulatory Visit (HOSPITAL_COMMUNITY): Payer: Self-pay

## 2022-06-04 MED ORDER — DEXMETHYLPHENIDATE HCL ER 30 MG PO CP24
30.0000 mg | ORAL_CAPSULE | Freq: Every morning | ORAL | 0 refills | Status: AC
  Filled 2022-06-04: qty 30, 30d supply, fill #0

## 2022-06-04 MED ORDER — DEXMETHYLPHENIDATE HCL 5 MG PO TABS
5.0000 mg | ORAL_TABLET | Freq: Every day | ORAL | 0 refills | Status: AC
  Filled 2022-06-04: qty 30, 30d supply, fill #0

## 2023-05-30 DIAGNOSIS — R002 Palpitations: Secondary | ICD-10-CM | POA: Diagnosis not present

## 2023-12-12 ENCOUNTER — Encounter (HOSPITAL_BASED_OUTPATIENT_CLINIC_OR_DEPARTMENT_OTHER): Payer: Self-pay | Admitting: *Deleted

## 2023-12-12 ENCOUNTER — Ambulatory Visit (INDEPENDENT_AMBULATORY_CARE_PROVIDER_SITE_OTHER): Payer: PRIVATE HEALTH INSURANCE | Admitting: Family Medicine

## 2023-12-12 ENCOUNTER — Encounter (HOSPITAL_BASED_OUTPATIENT_CLINIC_OR_DEPARTMENT_OTHER): Payer: Self-pay | Admitting: Family Medicine

## 2023-12-12 VITALS — BP 128/82 | HR 88 | Ht 66.0 in | Wt 176.0 lb

## 2023-12-12 DIAGNOSIS — Z23 Encounter for immunization: Secondary | ICD-10-CM | POA: Diagnosis not present

## 2023-12-12 DIAGNOSIS — Z0000 Encounter for general adult medical examination without abnormal findings: Secondary | ICD-10-CM | POA: Diagnosis not present

## 2023-12-12 DIAGNOSIS — Z13220 Encounter for screening for lipoid disorders: Secondary | ICD-10-CM | POA: Diagnosis not present

## 2023-12-12 DIAGNOSIS — Z7689 Persons encountering health services in other specified circumstances: Secondary | ICD-10-CM

## 2023-12-12 DIAGNOSIS — F909 Attention-deficit hyperactivity disorder, unspecified type: Secondary | ICD-10-CM | POA: Diagnosis not present

## 2023-12-12 NOTE — Progress Notes (Signed)
 Subjective:   Gavin Williams December 22, 2000 12/12/2023  CC: Chief Complaint  Patient presents with   New Patient (Initial Visit)    Patient is here today to get established with the practice. Denies any concerns for today's visit.    HPI: Gavin Williams is a 23 y.o. male who presents to establish care with PCP and for  a routine health maintenance exam. 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  Labs collected at time of visit.    ADHD FOLLOW UP Patient presents for the medical management of ADD/ADHD. Patient reports his Focalin  is currently prescribed by provider in [place], [place] as he attends med school there currently. Reports hx of binge eating disorder in the past as well that has been in remission for several years and improved by counseling.  Current medication regimen: Focalin  30mg  XR and Focalin  5mg  IR Medication compliance:  excellent compliance ADHD status: controlled ADHD Medication Side Effects: no    Decreased appetite: no    Headache: no    Sleeping disturbance pattern: no    Irritability: no    Rebound effects (worse than baseline) off medication: no    Anxiousness: no    Dizziness: no    Tics: no   Work/school performance:  excellent Difficulty sustaining attention/completing tasks: no Distracted by extraneous stimuli: no Does not listen when spoken to: no   HEALTH SCREENINGS: - Vision Screening: Hx of Lasik - Dental Visits: up to date - Testicular Exam: Declined - STD Screening: Declined - PSA (50+): Not applicable  No results found for: PSA1, PSA   - Colonoscopy (45+): Not applicable  Discussed with patient purpose of the colonoscopy is to detect colon cancer at curable precancerous or early stages  - AAA Screening: Not applicable  Men age 85-75 who have ever smoked - Lung Cancer screening with low-dose CT: Not applicable-  Adults age 57-80 who are current cigarette smokers or quit within the last 15 years. Must have 20 pack  year history.   Depression and Anxiety Screen done today and results listed below:     12/12/2023    8:32 AM  Depression screen PHQ 2/9  Decreased Interest 0  Down, Depressed, Hopeless 0  PHQ - 2 Score 0  Altered sleeping 0  Tired, decreased energy 0  Change in appetite 0  Feeling bad or failure about yourself  0  Trouble concentrating 0  Moving slowly or fidgety/restless 0  Suicidal thoughts 0  PHQ-9 Score 0  Difficult doing work/chores Not difficult at all      12/12/2023    8:32 AM  GAD 7 : Generalized Anxiety Score  Nervous, Anxious, on Edge 0  Control/stop worrying 0  Worry too much - different things 0  Trouble relaxing 0  Restless 0  Easily annoyed or irritable 0  Afraid - awful might happen 0  Total GAD 7 Score 0  Anxiety Difficulty Not difficult at all    IMMUNIZATIONS:  - Tdap: Tetanus vaccination status reviewed: last tetanus booster within 10 years. - Influenza: Given Today  - Pneumovax: Not applicable - Prevnar: Not applicable - Shingrix vaccine (50+): Not applicable   Past medical history, surgical history, medications, allergies, family history and social history reviewed with patient today and changes made to appropriate areas of the chart.   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   Dexmethylphenidate  HCl 30 MG CP24 Take 1 capsule (30 mg total) by mouth every morning.   dexmethylphenidate  (FOCALIN ) 5 MG tablet Take 1 to 2 tablets by mouth daily as needed   Dexmethylphenidate  HCl (FOCALIN  XR) 30 MG CP24 Take 1 capsule by mouth in the morning 10/01/21   No current facility-administered medications on file prior to visit.    No Known Allergies   Social History   Socioeconomic History   Marital status: Single    Spouse name: Not on file   Number of children: Not on file   Years of education: Not on file   Highest education level: Bachelor's degree (e.g., BA, AB, BS)  Occupational History   Not on file  Tobacco Use   Smoking status: Never   Smokeless tobacco: Never   Tobacco comments:    N/A - never used and will never use  Substance and Sexual Activity   Alcohol use: No   Drug use: No   Sexual activity: Yes    Birth control/protection: Condom, I.U.D.    Comment: Active with same male partner for last 7 years  Other Topics Concern   Not on file  Social History Narrative   Not on file   Social Drivers of Health   Financial Resource Strain: Low Risk  (12/08/2023)   Overall Financial Resource Strain (CARDIA)    Difficulty of Paying Living Expenses: Not hard at all  Food Insecurity: No Food Insecurity (12/08/2023)   Hunger Vital Sign    Worried About Running Out of Food in the Last Year: Never true    Ran Out of Food in the Last Year: Never true  Transportation Needs: No Transportation Needs (12/08/2023)   PRAPARE - Administrator, Civil Service (Medical): No    Lack of Transportation (Non-Medical): No  Physical Activity: Sufficiently Active (12/08/2023)   Exercise Vital Sign    Days of Exercise per Week: 6 days    Minutes of Exercise per Session: 60 min  Stress: No Stress Concern Present (12/08/2023)   [employer] of Occupational Health - Occupational Stress Questionnaire    Feeling of  Stress: Only a little  Social Connections: Moderately Isolated (12/08/2023)   Social Connection and Isolation Panel    Frequency of Communication with Friends and Family: Once a week    Frequency of Social Gatherings with Friends and Family: Once a week    Attends Religious Services: 1 to 4 times per year    Active Member of [employer] or Organizations: No    Attends Engineer, structural: Not on file    Marital Status: Living with partner  Intimate Partner Violence: Not on file   Social History   Tobacco Use  Smoking Status Never  Smokeless Tobacco Never  Tobacco Comments   N/A - never used and will never use   Social History   Substance  and Sexual Activity  Alcohol Use No     Family History  Problem Relation Age of Onset   Hypertension Mother    Alcohol abuse Mother    Intellectual disability Brother    Learning disabilities Brother    Hyperlipidemia Maternal Grandmother    Arthritis Maternal Grandmother    Diabetes Maternal Grandfather    Hypertension Maternal Grandfather    Cancer Maternal Grandfather        Esophageal   Heart disease Paternal Grandfather      ROS: Denies fever, fatigue, unexplained weight loss/gain, CP, SHOB, and palpatitations. Denies neurological deficits, gastrointestinal and/or genitourinary complaints, and skin changes.   Objective:   Today's Vitals   12/12/23 0829 12/12/23 0857  BP: (!) 163/76 128/82  Pulse: 88   SpO2: 100%   Weight: 176 lb (79.8 kg)   Height: 5' 6 (1.676 m)     GENERAL APPEARANCE: Well-appearing, in NAD. Well nourished.  SKIN: Pink, warm and dry. Turgor normal. No rash, lesion, ulceration, or ecchymoses. Hair evenly distributed.  HEENT: HEAD: Normocephalic.  EYES: PERRLA. EOMI. Lids intact w/o defect. Sclera white, Conjunctiva pink w/o exudate.  EARS: External ear w/o redness, swelling, masses or lesions. EAC clear. TM's intact, translucent w/o bulging, appropriate landmarks visualized. Appropriate acuity to  conversational tones.  NOSE: Septum midline w/o deformity. Nares patent, mucosa pink and non-inflamed w/o drainage. No sinus tenderness.  THROAT: Uvula midline. Oropharynx clear. Tonsils non-inflamed w/o exudate. Oral mucosa pink and moist.  NECK: Supple, Trachea midline. Full ROM w/o pain or tenderness. No lymphadenopathy. Thyroid non-tender w/o enlargement or palpable masses.  RESPIRATORY: Chest wall symmetrical w/o masses. Respirations even and non-labored. Breath sounds clear to auscultation bilaterally. No wheezes, rales, rhonchi, or crackles. CARDIAC: S1, S2 present, regular rate and rhythm. No gallops, murmurs, rubs, or clicks. PMI w/o lifts, heaves, or thrills. No carotid bruits. Capillary refill <2 seconds. Peripheral pulses 2+ bilaterally. GI: Abdomen soft w/o distention. Normoactive bowel sounds. No palpable masses or tenderness. No guarding or rebound tenderness. Liver and spleen w/o tenderness or enlargement. No CVA tenderness.  GU: Pt deferred exam. MSK: Muscle tone and strength appropriate for age, w/o atrophy or abnormal movement. EXTREMITIES: Active ROM intact, w/o tenderness, crepitus, or contracture. No obvious joint deformities or effusions. No clubbing, edema, or cyanosis.  NEUROLOGIC: CN's II-XII intact. Motor strength symmetrical with no obvious weakness. No sensory deficits. DTR 2+ symmetric bilaterally. Steady, even gait.  PSYCH/MENTAL STATUS: Alert, oriented x 3. Cooperative, appropriate mood and affect.    Assessment & Plan:  1. Encounter to establish care with new doctor Discussed role of PCP and reviewed his medical history, current problems, medications and family history.   2. Attention deficit hyperactivity disorder (ADHD), unspecified ADHD type Controlled currently per patient. Continue prescriber in [place]. PDMP reviewed.   3. Annual physical exam (Primary) Discussed preventative screenings, vaccines, and healthy lifestyle with patient. Fasting labs obtained as  part of AE today.  - CBC with Differential/Platelet - Comprehensive metabolic panel with GFR - Lipid panel - TSH - Hemoglobin A1c  4. Screening for lipid disorders - Lipid panel  5. Encounter for immunization VIS provided.  - Flu vaccine trivalent PF, 6mos and older(Flulaval,Afluria,Fluarix,Fluzone)    Orders Placed This Encounter  Procedures   Flu vaccine trivalent PF, 6mos and older(Flulaval,Afluria,Fluarix,Fluzone)   CBC with Differential/Platelet   Comprehensive metabolic panel with GFR   Lipid panel   TSH   Hemoglobin A1c    PATIENT COUNSELING: - Encouraged to adjust caloric intake  to maintain or achieve ideal body weight, to reduce intake of dietary saturated fat and total fat, to limit sodium intake by avoiding high sodium foods and not adding table salt, and to maintain adequate dietary potassium and calcium preferably from fresh fruits, vegetables, and low-fat dairy products.   - Advised to avoid cigarette smoking. - Discussed with the patient that most people either abstain from alcohol or drink within safe limits (<=14/week and <=4 drinks/occasion for males, <=7/weeks and <= 3 drinks/occasion for females) and that the risk for alcohol disorders and other health effects rises proportionally with the number of drinks per week and how often a drinker exceeds daily limits. - Discussed cessation/primary prevention of drug use and availability of treatment for abuse.   - Stressed the importance of regular exercise - Injury prevention: Discussed safety belts, safety helmets, smoke detector, smoking near bedding or upholstery.  - Dental health: Discussed importance of regular tooth brushing, flossing, and dental visits.  - Sexuality: Discussed sexually transmitted diseases, partner selection, use of condoms, avoidance of unintended pregnancy  and contraceptive alternatives.   NEXT PREVENTATIVE PHYSICAL DUE IN 1 YEAR.  Return in about 1 year (around 12/11/2024) for ANNUAL  PHYSICAL.  Patient to reach out to office if new, worrisome, or unresolved symptoms arise or if no improvement in patient's condition. Patient verbalized understanding and is agreeable to treatment plan. All questions answered to patient's satisfaction.    Thersia Schuyler Stark, [place]

## 2023-12-12 NOTE — Patient Instructions (Signed)

## 2023-12-13 ENCOUNTER — Ambulatory Visit (HOSPITAL_BASED_OUTPATIENT_CLINIC_OR_DEPARTMENT_OTHER): Payer: Self-pay | Admitting: Family Medicine

## 2023-12-13 DIAGNOSIS — R7303 Prediabetes: Secondary | ICD-10-CM | POA: Insufficient documentation

## 2023-12-13 LAB — CBC WITH DIFFERENTIAL/PLATELET
Basophils Absolute: 0 x10E3/uL (ref 0.0–0.2)
Basos: 1 %
EOS (ABSOLUTE): 0.2 x10E3/uL (ref 0.0–0.4)
Eos: 4 %
Hematocrit: 45.8 % (ref 37.5–51.0)
Hemoglobin: 14.8 g/dL (ref 13.0–17.7)
Immature Grans (Abs): 0 x10E3/uL (ref 0.0–0.1)
Immature Granulocytes: 0 %
Lymphocytes Absolute: 2.1 x10E3/uL (ref 0.7–3.1)
Lymphs: 44 %
MCH: 27.5 pg (ref 26.6–33.0)
MCHC: 32.3 g/dL (ref 31.5–35.7)
MCV: 85 fL (ref 79–97)
Monocytes Absolute: 0.5 x10E3/uL (ref 0.1–0.9)
Monocytes: 11 %
Neutrophils Absolute: 1.9 x10E3/uL (ref 1.4–7.0)
Neutrophils: 40 %
Platelets: 271 x10E3/uL (ref 150–450)
RBC: 5.38 x10E6/uL (ref 4.14–5.80)
RDW: 14.6 % (ref 11.6–15.4)
WBC: 4.8 x10E3/uL (ref 3.4–10.8)

## 2023-12-13 LAB — COMPREHENSIVE METABOLIC PANEL WITH GFR
ALT: 19 IU/L (ref 0–44)
AST: 23 IU/L (ref 0–40)
Albumin: 4.8 g/dL (ref 4.3–5.2)
Alkaline Phosphatase: 61 IU/L (ref 47–123)
BUN/Creatinine Ratio: 13 (ref 9–20)
BUN: 12 mg/dL (ref 6–20)
Bilirubin Total: 0.5 mg/dL (ref 0.0–1.2)
CO2: 20 mmol/L (ref 20–29)
Calcium: 9.5 mg/dL (ref 8.7–10.2)
Chloride: 103 mmol/L (ref 96–106)
Creatinine, Ser: 0.95 mg/dL (ref 0.76–1.27)
Globulin, Total: 2.4 g/dL (ref 1.5–4.5)
Glucose: 100 mg/dL — ABNORMAL HIGH (ref 70–99)
Potassium: 4.1 mmol/L (ref 3.5–5.2)
Sodium: 140 mmol/L (ref 134–144)
Total Protein: 7.2 g/dL (ref 6.0–8.5)
eGFR: 115 mL/min/1.73 (ref >59)

## 2023-12-13 LAB — HEMOGLOBIN A1C
Est. average glucose Bld gHb Est-mCnc: 117 mg/dL
Hgb A1c MFr Bld: 5.7 % — ABNORMAL HIGH (ref 4.8–5.6)

## 2023-12-13 LAB — LIPID PANEL
Chol/HDL Ratio: 2.9 ratio (ref 0.0–5.0)
Cholesterol, Total: 174 mg/dL (ref 100–199)
HDL: 60 mg/dL (ref >39)
LDL Chol Calc (NIH): 102 mg/dL — ABNORMAL HIGH (ref 0–99)
Triglycerides: 61 mg/dL (ref 0–149)
VLDL Cholesterol Cal: 12 mg/dL (ref 5–40)

## 2023-12-13 LAB — TSH: TSH: 1.59 u[IU]/mL (ref 0.450–4.500)

## 2023-12-13 NOTE — Progress Notes (Signed)
 Hi Akshat, Your electrolytes, kidney function and liver function is stable. Your cholesterol is stable. Continue a heart healthy diet and exercise. Your A1C is in the range of prediabetes at 5.7. Please continue to decrease carbohydrates, sugar intake and exercise regularly. Thyroid function and CBC is stable.

## 2024-12-12 ENCOUNTER — Encounter (HOSPITAL_BASED_OUTPATIENT_CLINIC_OR_DEPARTMENT_OTHER): Admitting: Family Medicine
